# Patient Record
Sex: Female | Born: 1985 | Race: Black or African American | Hispanic: No
Health system: Southern US, Academic
[De-identification: ages and names within clinical notes are randomized; demographics above are authoritative.]

## PROBLEM LIST (undated history)

## (undated) DIAGNOSIS — M329 Systemic lupus erythematosus, unspecified: Secondary | ICD-10-CM

## (undated) DIAGNOSIS — I10 Essential (primary) hypertension: Secondary | ICD-10-CM

---

## 2019-12-11 ENCOUNTER — Emergency Department
Admission: EM | Admit: 2019-12-11 | Discharge: 2019-12-12 | Disposition: A | Discharge status: Home / Self Care | Payer: Self-pay | Attending: Emergency Medicine | Admitting: Emergency Medicine

## 2019-12-11 ENCOUNTER — Encounter (HOSPITAL_COMMUNITY): Payer: Self-pay

## 2019-12-11 ENCOUNTER — Other Ambulatory Visit: Payer: Self-pay

## 2019-12-11 DIAGNOSIS — M329 Systemic lupus erythematosus, unspecified: Secondary | ICD-10-CM | POA: Insufficient documentation

## 2019-12-11 DIAGNOSIS — R42 Dizziness and giddiness: Secondary | ICD-10-CM | POA: Insufficient documentation

## 2019-12-11 DIAGNOSIS — M791 Myalgia, unspecified site: Secondary | ICD-10-CM | POA: Insufficient documentation

## 2019-12-11 DIAGNOSIS — R110 Nausea: Secondary | ICD-10-CM | POA: Insufficient documentation

## 2019-12-11 HISTORY — DX: Essential (primary) hypertension: I10

## 2019-12-11 HISTORY — DX: Systemic lupus erythematosus, unspecified (CMS HCC): M32.9

## 2019-12-11 MED ORDER — MECLIZINE 12.5 MG TABLET
12.50 mg | ORAL_TABLET | ORAL | Status: AC
Start: 2019-12-12 — End: 2019-12-12
  Administered 2019-12-12: 12.5 mg via ORAL
  Filled 2019-12-11: qty 1
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
## 2019-12-12 ENCOUNTER — Other Ambulatory Visit: Payer: Self-pay

## 2019-12-12 LAB — CBC WITH DIFF
BASOPHIL #: <0.1 10*3/uL (ref <=0.20)
BASOPHIL %: 1 %
EOSINOPHIL #: 0.12 10*3/uL (ref <=0.50)
EOSINOPHIL %: 1 %
HCT: 37 % (ref 34.8–46.0)
HGB: 12.3 g/dL (ref 11.5–16.0)
IMMATURE GRANULOCYTE #: <0.1 10*3/uL (ref <0.10)
IMMATURE GRANULOCYTE %: 0 % (ref 0–1)
LYMPHOCYTE #: 2.51 10*3/uL (ref 1.00–4.80)
LYMPHOCYTE %: 27 %
MCH: 30.1 pg (ref 26.0–32.0)
MCHC: 33.2 g/dL (ref 31.0–35.5)
MCV: 90.5 fL (ref 78.0–100.0)
MONOCYTE #: 0.59 10*3/uL (ref 0.20–1.10)
MONOCYTE %: 6 %
MPV: 9.2 fL (ref 8.7–12.5)
NEUTROPHIL #: 6.11 10*3/uL (ref 1.50–7.70)
NEUTROPHIL %: 65 %
PLATELETS: 507 10*3/uL — ABNORMAL HIGH (ref 150–400)
RBC: 4.09 10*6/uL (ref 3.85–5.22)
RDW-CV: 12.2 % (ref 11.5–15.5)
WBC: 9.4 10*3/uL (ref 3.7–11.0)

## 2019-12-12 LAB — URINALYSIS, MACROSCOPIC
BILIRUBIN: NEGATIVE mg/dL
GLUCOSE: NEGATIVE mg/dL
KETONES: NEGATIVE mg/dL
NITRITE: NEGATIVE
PH: 5.5 (ref 5.0–8.5)
PROTEIN: NEGATIVE mg/dL
SPECIFIC GRAVITY: 1.01 (ref 1.005–1.030)
UROBILINOGEN: 0.2 mg/dL

## 2019-12-12 LAB — BASIC METABOLIC PANEL
ANION GAP: 10 mmol/L (ref 4–13)
BUN/CREA RATIO: 11 (ref 6–22)
BUN: 11 mg/dL (ref 8–25)
CALCIUM: 9.6 mg/dL (ref 8.5–10.0)
CHLORIDE: 106 mmol/L (ref 96–111)
CO2 TOTAL: 21 mmol/L — ABNORMAL LOW (ref 22–30)
CREATININE: 0.99 mg/dL (ref 0.60–1.05)
GLUCOSE: 118 mg/dL (ref 65–125)
POTASSIUM: 3 mmol/L — ABNORMAL LOW (ref 3.5–5.1)
SODIUM: 137 mmol/L (ref 136–145)

## 2019-12-12 LAB — URINALYSIS, MICROSCOPIC

## 2019-12-12 LAB — GRAY TOP TUBE

## 2019-12-12 LAB — GOLD TOP TUBE

## 2019-12-12 MED ORDER — POTASSIUM CHLORIDE ER 20 MEQ TABLET,EXTENDED RELEASE(PART/CRYST)
20.00 meq | ORAL_TABLET | ORAL | Status: AC
Start: 2019-12-12 — End: 2019-12-12
  Administered 2019-12-12: 20 meq via ORAL
  Filled 2019-12-12: qty 1

## 2019-12-12 MED ORDER — SODIUM CHLORIDE 0.9 % IV BOLUS
1000.00 mL | INJECTION | Status: AC
Start: 2019-12-12 — End: 2019-12-12
  Administered 2019-12-12: 0 mL via INTRAVENOUS
  Administered 2019-12-12: 1000 mL via INTRAVENOUS

## 2019-12-12 MED ORDER — PROMETHAZINE 25 MG TABLET
25.00 mg | ORAL_TABLET | ORAL | Status: AC
Start: 2019-12-12 — End: 2019-12-12
  Administered 2019-12-12: 25 mg via ORAL
  Filled 2019-12-12: qty 1

## 2019-12-12 MED ORDER — TRAMADOL 50 MG TABLET
50.00 mg | ORAL_TABLET | ORAL | Status: AC
Start: 2019-12-12 — End: 2019-12-12
  Administered 2019-12-12: 50 mg via ORAL
  Filled 2019-12-12: qty 1

## 2019-12-12 NOTE — ED Nurses Note (Signed)
AVS provided. Pt educated on home care and follow up. Pt verbalized understanding. Pt discharged home via POV.

## 2019-12-12 NOTE — Discharge Instructions (Addendum)
General Instructions:  You are considered stable for discharge from the emergency department. Please carefully follow all the instructions you were given verbally as well as the written instructions given below. In general, immediately return to the emergency department if the symptoms you presented with today increase in severity, change in any way, and/or do not improve in what you consider an acceptable time frame.  Return if you develop fever >101, vomiting, oral liquid intolerance, chest pain, shortness of breath, weakness, change in behavior, or any other concerns.    Medication(s) Instructions (if applicable):  If you were given any medication(s) upon discharge, please strictly follow the directions as prescribed for taking the medication(s). Should you feel you develop any type of reaction to the medication(s), including, but not limited to, rash, swelling of the lips or face, or difficulty breathing, immediately discontinue the use of the medication(s) and seek prompt medical care. Please read the medication(s) insert provided by the pharmacy and follow all guidelines and recommendations. Please take all medications as prescribed for your symptoms or diagnoses.                Follow-Up Instructions:  If you were given instructions to follow-up with a health care provider upon discharge, please be sure to do so.  It is your responsibility to call and/or make an appointment with the health care providers listed on your discharge papers and/or your primary care provider in the appropriate time frame given.  Please take a copy of your discharge papers with you to your follow-up appointment(s). Please follow up with your primary care physician in 7 days or earlier if needed for your symptoms. Please follow up with any specialist provider in clinic that your were given instructions to see as an outpatient.     YOU MUST CALL THE NUMBER LISTED ON THE DISCHARGE PAPERWORK TO CONFIRM YOUR APPOINTMENT.    Special  Information / Instructions:  Please read and follow all attached discharge instructions.      Pertaining to your visit,  a quick summary of PERTINENT FINDINGS are as follows:  Vertigo  Myalgias probably related to stress to used lupus flare.  Low potassium    Pertaining to these findings, my discharge INSTRUCTIONS and RECOMMENDATIONS are as follows:  Symptomatic treatment today with some fluid some anti nausea medicine some pain medication was provided.  Strongly recommend establishing rest.  Push oral fluids.  Return as needed.      Please call the [facility] Emergency Department at [phone] with any questions or concerns.    Please return to the Emergency Department if you have persistence or worsening of your symptoms.
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
## 2019-12-12 NOTE — ED Nurses Note (Signed)
Lab at bedside
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
